# Patient Record
Sex: Female | Born: 1940 | Race: White | Hispanic: No | Smoking: Never smoker
Health system: Southern US, Community
[De-identification: ages and names within clinical notes are randomized; demographics above are authoritative.]

## PROBLEM LIST (undated history)

## (undated) DIAGNOSIS — C801 Malignant (primary) neoplasm, unspecified: Secondary | ICD-10-CM

## (undated) DIAGNOSIS — E7800 Pure hypercholesterolemia, unspecified: Secondary | ICD-10-CM

---

## 2015-05-15 ENCOUNTER — Other Ambulatory Visit: Payer: Self-pay | Admitting: Family Medicine

## 2015-05-15 DIAGNOSIS — Z780 Asymptomatic menopausal state: Secondary | ICD-10-CM

## 2015-05-18 ENCOUNTER — Other Ambulatory Visit: Payer: Self-pay | Admitting: Family Medicine

## 2015-05-18 DIAGNOSIS — Z1231 Encounter for screening mammogram for malignant neoplasm of breast: Secondary | ICD-10-CM

## 2015-05-24 ENCOUNTER — Ambulatory Visit
Admission: RE | Admit: 2015-05-24 | Discharge: 2015-05-24 | Disposition: A | Discharge status: Home / Self Care | Payer: Medicare Other | Source: Ambulatory Visit | Attending: Family Medicine | Admitting: Family Medicine

## 2015-05-24 DIAGNOSIS — Z780 Asymptomatic menopausal state: Secondary | ICD-10-CM

## 2015-05-24 DIAGNOSIS — Z1231 Encounter for screening mammogram for malignant neoplasm of breast: Secondary | ICD-10-CM | POA: Diagnosis not present

## 2015-05-24 HISTORY — DX: Malignant (primary) neoplasm, unspecified: C80.1

## 2016-08-05 ENCOUNTER — Other Ambulatory Visit: Payer: Self-pay | Admitting: Family Medicine

## 2016-08-05 DIAGNOSIS — Z1231 Encounter for screening mammogram for malignant neoplasm of breast: Secondary | ICD-10-CM

## 2016-08-12 ENCOUNTER — Encounter (INDEPENDENT_AMBULATORY_CARE_PROVIDER_SITE_OTHER): Payer: Self-pay

## 2016-08-12 ENCOUNTER — Ambulatory Visit
Admission: RE | Admit: 2016-08-12 | Discharge: 2016-08-12 | Disposition: A | Discharge status: Home / Self Care | Payer: Medicare Other | Source: Ambulatory Visit | Attending: Family Medicine | Admitting: Family Medicine

## 2016-08-12 ENCOUNTER — Other Ambulatory Visit: Payer: Self-pay | Admitting: Family Medicine

## 2016-08-12 DIAGNOSIS — Z1231 Encounter for screening mammogram for malignant neoplasm of breast: Secondary | ICD-10-CM | POA: Insufficient documentation

## 2017-02-25 ENCOUNTER — Other Ambulatory Visit: Payer: Self-pay | Admitting: Family Medicine

## 2017-02-25 DIAGNOSIS — Z780 Asymptomatic menopausal state: Secondary | ICD-10-CM

## 2017-08-26 ENCOUNTER — Other Ambulatory Visit: Payer: Self-pay | Admitting: Family Medicine

## 2017-08-26 DIAGNOSIS — Z1231 Encounter for screening mammogram for malignant neoplasm of breast: Secondary | ICD-10-CM

## 2017-09-14 ENCOUNTER — Ambulatory Visit
Admission: RE | Admit: 2017-09-14 | Discharge: 2017-09-14 | Disposition: A | Discharge status: Home / Self Care | Payer: Medicare Other | Source: Ambulatory Visit | Attending: Family Medicine | Admitting: Family Medicine

## 2017-09-14 DIAGNOSIS — Z1231 Encounter for screening mammogram for malignant neoplasm of breast: Secondary | ICD-10-CM | POA: Diagnosis present

## 2018-03-01 ENCOUNTER — Other Ambulatory Visit: Payer: Self-pay | Admitting: Family Medicine

## 2018-03-01 DIAGNOSIS — Z780 Asymptomatic menopausal state: Secondary | ICD-10-CM

## 2019-01-05 ENCOUNTER — Other Ambulatory Visit: Payer: Self-pay | Admitting: Family Medicine

## 2019-01-05 DIAGNOSIS — Z1231 Encounter for screening mammogram for malignant neoplasm of breast: Secondary | ICD-10-CM

## 2019-01-24 ENCOUNTER — Ambulatory Visit
Admission: RE | Admit: 2019-01-24 | Discharge: 2019-01-24 | Disposition: A | Discharge status: Home / Self Care | Payer: Medicare Other | Source: Ambulatory Visit | Attending: Family Medicine | Admitting: Family Medicine

## 2019-01-24 ENCOUNTER — Encounter (INDEPENDENT_AMBULATORY_CARE_PROVIDER_SITE_OTHER): Payer: Self-pay

## 2019-01-24 ENCOUNTER — Other Ambulatory Visit: Payer: Self-pay

## 2019-01-24 DIAGNOSIS — Z1231 Encounter for screening mammogram for malignant neoplasm of breast: Secondary | ICD-10-CM

## 2019-01-24 DIAGNOSIS — Z780 Asymptomatic menopausal state: Secondary | ICD-10-CM | POA: Insufficient documentation

## 2020-02-15 ENCOUNTER — Other Ambulatory Visit: Payer: Self-pay | Admitting: Family Medicine

## 2020-02-15 DIAGNOSIS — Z1231 Encounter for screening mammogram for malignant neoplasm of breast: Secondary | ICD-10-CM

## 2020-02-22 ENCOUNTER — Other Ambulatory Visit: Payer: Self-pay

## 2020-02-22 ENCOUNTER — Ambulatory Visit
Admission: RE | Admit: 2020-02-22 | Discharge: 2020-02-22 | Disposition: A | Discharge status: Home / Self Care | Payer: Medicare Other | Source: Ambulatory Visit | Attending: Family Medicine | Admitting: Family Medicine

## 2020-02-22 DIAGNOSIS — Z1231 Encounter for screening mammogram for malignant neoplasm of breast: Secondary | ICD-10-CM | POA: Diagnosis present

## 2021-02-12 ENCOUNTER — Other Ambulatory Visit: Payer: Self-pay | Admitting: Family Medicine

## 2021-02-12 DIAGNOSIS — Z1231 Encounter for screening mammogram for malignant neoplasm of breast: Secondary | ICD-10-CM

## 2021-03-25 ENCOUNTER — Other Ambulatory Visit: Payer: Self-pay

## 2021-03-25 ENCOUNTER — Ambulatory Visit
Admission: RE | Admit: 2021-03-25 | Discharge: 2021-03-25 | Disposition: A | Discharge status: Home / Self Care | Payer: Medicare Other | Source: Ambulatory Visit | Attending: Family Medicine | Admitting: Family Medicine

## 2021-03-25 DIAGNOSIS — Z1231 Encounter for screening mammogram for malignant neoplasm of breast: Secondary | ICD-10-CM | POA: Insufficient documentation

## 2021-04-20 ENCOUNTER — Ambulatory Visit
Admission: EM | Admit: 2021-04-20 | Discharge: 2021-04-20 | Disposition: A | Discharge status: Home / Self Care | Payer: Medicare Other | Attending: Emergency Medicine | Admitting: Emergency Medicine

## 2021-04-20 ENCOUNTER — Other Ambulatory Visit: Payer: Self-pay

## 2021-04-20 DIAGNOSIS — R300 Dysuria: Secondary | ICD-10-CM | POA: Diagnosis present

## 2021-04-20 DIAGNOSIS — N3001 Acute cystitis with hematuria: Secondary | ICD-10-CM | POA: Insufficient documentation

## 2021-04-20 HISTORY — DX: Pure hypercholesterolemia, unspecified: E78.00

## 2021-04-20 LAB — URINALYSIS, COMPLETE (UACMP) WITH MICROSCOPIC
Bilirubin Urine: NEGATIVE
Glucose, UA: NEGATIVE mg/dL
Ketones, ur: NEGATIVE mg/dL
Nitrite: NEGATIVE
Protein, ur: 30 mg/dL — AB
RBC / HPF: >50 RBC/hpf (ref 0–5)
Specific Gravity, Urine: 1.01 (ref 1.005–1.030)
pH: 5.5 (ref 5.0–8.0)

## 2021-04-20 MED ORDER — CIPROFLOXACIN HCL 250 MG PO TABS: 250.0000 mg | ORAL_TABLET | Freq: Two times a day (BID) | ORAL | 0 refills | Status: AC

## 2021-04-20 NOTE — ED Triage Notes (Signed)
Pt with 2 days of pain at end of urine stream and today saw blood in urine

## 2021-04-20 NOTE — Discharge Instructions (Signed)

## 2021-04-20 NOTE — ED Provider Notes (Signed)
[facility]    CSN: [id number] Arrival date & time: 04/20/21  2703      History   Chief Complaint Chief Complaint  Patient presents with   Dysuria    HPI Kiara Alvarado is a 80 y.o. female presenting for 2-day history of dysuria, urinary frequency and urgency as well as bladder pressure and blood in the urine.  Patient reports she believes she has a UTI.  Has had UTIs in the past and says she normally gets about 1 year.  She denies any fever, chills, fatigue, lower back pain or abdominal pain.  Has not been taking any OTC meds for symptoms.  No other complaints.  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   History reviewed. No pertinent surgical history.  OB History   No obstetric history on file.      Home Medications    Prior to Admission medications   Medication Sig Start Date End Date Taking? Authorizing Provider  atorvastatin (LIPITOR) 20 MG tablet Take 1 tablet by mouth daily. 09/07/20  Yes [provider]  Cholecalciferol 50 MCG (2000 UT) TABS Take by mouth. 02/24/17  Yes [provider]  ciprofloxacin (CIPRO) 250 MG tablet Take 1 tablet (250 mg total) by mouth every 12 (twelve) hours for 7 days. 04/20/21 04/27/21 Yes Danton Clap, PA-C  aspirin 81 MG EC tablet Take by mouth.    [provider]    Family History Family History  Problem Relation Age of Onset   Breast cancer Neg Hx     Social History Social History   Tobacco Use   Smoking status: Never   Smokeless tobacco: Never  Vaping Use   Vaping Use: Never used  Substance Use Topics   Alcohol use: Yes    Comment: occasional   Drug use: Never     Allergies   Patient has no known allergies.   Review of Systems Review of Systems  Constitutional:  Negative for chills, fatigue and fever.  Gastrointestinal:  Negative for  abdominal pain, diarrhea, nausea and vomiting.  Genitourinary:  Positive for dysuria, frequency, hematuria and urgency. Negative for decreased urine volume, flank pain, pelvic pain, vaginal bleeding, vaginal discharge and vaginal pain.  Musculoskeletal:  Negative for back pain.  Skin:  Negative for rash.    Physical Exam Triage Vital Signs ED Triage Vitals  Enc Vitals Group     BP 04/20/21 0955 (!) 141/75     Pulse Rate 04/20/21 0955 65     Resp 04/20/21 0955 16     Temp 04/20/21 0955 97.9 F (36.6 C)     Temp Source 04/20/21 0955 Oral     SpO2 04/20/21 0955 98 %     Weight 04/20/21 0956 133 lb (60.3 kg)     Height 04/20/21 0956 5\' 5"  (1.651 m)     Head Circumference --      Peak Flow --      Pain Score 04/20/21 0956 9     Pain Loc --      Pain Edu? --      Excl. in [place]? --    No data found.  Updated Vital Signs BP (!) 141/75 (BP Location: Left Arm)   Pulse 65   Temp 97.9 F (36.6 C) (Oral)   Resp 16   Ht  5\' 5"  (1.651 m)   Wt 133 lb (60.3 kg)   SpO2 98%   BMI 22.13 kg/m   Physical Exam Vitals and nursing note reviewed.  Constitutional:      General: She is not in acute distress.    Appearance: Normal appearance. She is not ill-appearing or toxic-appearing.  HENT:     Head: Normocephalic and atraumatic.  Eyes:     General: No scleral icterus.       Right eye: No discharge.        Left eye: No discharge.     Conjunctiva/sclera: Conjunctivae normal.  Cardiovascular:     Rate and Rhythm: Normal rate and regular rhythm.     Heart sounds: Normal heart sounds.  Pulmonary:     Effort: Pulmonary effort is normal. No respiratory distress.     Breath sounds: Normal breath sounds.  Abdominal:     Palpations: Abdomen is soft.     Tenderness: There is abdominal tenderness (mild suprapubic). There is no right CVA tenderness or left CVA tenderness.  Musculoskeletal:     Cervical back: Neck supple.  Skin:    General: Skin is dry.  Neurological:     General: No focal  deficit present.     Mental Status: She is alert. Mental status is at baseline.     Motor: No weakness.     Gait: Gait normal.  Psychiatric:        Mood and Affect: Mood normal.        Behavior: Behavior normal.        Thought Content: Thought content normal.     UC Treatments / Results  Labs (all labs ordered are listed, but only abnormal results are displayed) Labs Reviewed  URINALYSIS, COMPLETE (UACMP) WITH MICROSCOPIC - Abnormal; Notable for the following components:      Result Value   APPearance CLOUDY (*)    Hgb urine dipstick LARGE (*)    Protein, ur 30 (*)    Leukocytes,Ua MODERATE (*)    Bacteria, UA FEW (*)    All other components within normal limits  URINE CULTURE    EKG   Radiology No results found.  Procedures Procedures (including critical care time)  Medications Ordered in UC Medications - No data to display  Initial Impression / Assessment and Plan / UC Course  I have reviewed the triage vital signs and the nursing notes.  Pertinent labs & imaging results that were available during my care of the patient were reviewed by me and considered in my medical decision making (see chart for details).   80 y/o female presenting for dysuria, urinary frequency and urgency.  No fever, chills or red flag signs/symptoms.  Urinalysis today shows cloudy urine, large hemoglobin, protein and moderate leukocytes.  We will send urine for culture and treat for UTI.  Patient reports that she is normally prescribed Cipro because it works the best for her.  I have sent Cipro to pharmacy.  Advised to increase rest and fluids.  Thoroughly reviewed return and ED precautions advised her we may change her antibiotic based on urine culture.  Follow-up here as needed.   Final Clinical Impressions(s) / UC Diagnoses   Final diagnoses:  Acute cystitis with hematuria  Dysuria     Discharge Instructions      UTI: Based on either symptoms or urinalysis, you may have a urinary  tract infection. We will send the urine for culture and call with results in a few days. Begin  antibiotics at this time. Your symptoms should be much improved over the next 2-3 days. Increase rest and fluid intake. If for some reason symptoms are worsening or not improving after a couple of days or the urine culture determines the antibiotics you are taking will not treat the infection, the antibiotics may be changed. Return or go to ER for fever, back pain, worsening urinary pain, discharge, increased blood in urine. May take Tylenol or Motrin OTC for pain relief or consider AZO if no contraindications      ED Prescriptions     Medication Sig Dispense Auth. Provider   ciprofloxacin (CIPRO) 250 MG tablet Take 1 tablet (250 mg total) by mouth every 12 (twelve) hours for 7 days. 14 tablet Gretta Cool      PDMP not reviewed this encounter.   Danton Clap, PA-C 04/20/21 1102

## 2021-04-22 LAB — URINE CULTURE: Culture: >=100000 — AB

## 2022-02-21 ENCOUNTER — Other Ambulatory Visit: Payer: Self-pay | Admitting: Family Medicine

## 2022-02-21 DIAGNOSIS — Z1231 Encounter for screening mammogram for malignant neoplasm of breast: Secondary | ICD-10-CM

## 2022-04-08 ENCOUNTER — Ambulatory Visit
Admission: RE | Admit: 2022-04-08 | Discharge: 2022-04-08 | Disposition: A | Discharge status: Home / Self Care | Payer: Medicare Other | Source: Ambulatory Visit | Attending: Family Medicine | Admitting: Family Medicine

## 2022-04-08 DIAGNOSIS — Z1231 Encounter for screening mammogram for malignant neoplasm of breast: Secondary | ICD-10-CM | POA: Diagnosis not present

## 2022-08-19 ENCOUNTER — Other Ambulatory Visit: Payer: Self-pay

## 2022-08-19 ENCOUNTER — Ambulatory Visit
Admission: EM | Admit: 2022-08-19 | Discharge: 2022-08-19 | Disposition: A | Discharge status: Home / Self Care | Payer: Medicare Other | Attending: Emergency Medicine | Admitting: Emergency Medicine

## 2022-08-19 ENCOUNTER — Encounter: Payer: Self-pay | Admitting: *Deleted

## 2022-08-19 DIAGNOSIS — N3001 Acute cystitis with hematuria: Secondary | ICD-10-CM | POA: Diagnosis not present

## 2022-08-19 LAB — URINALYSIS, W/ REFLEX TO CULTURE (INFECTION SUSPECTED)
Bilirubin Urine: NEGATIVE
Glucose, UA: NEGATIVE mg/dL
Ketones, ur: NEGATIVE mg/dL
Nitrite: NEGATIVE
Protein, ur: NEGATIVE mg/dL
Specific Gravity, Urine: <1.005 — ABNORMAL LOW (ref 1.005–1.030)
WBC, UA: >50 WBC/hpf (ref 0–5)
pH: 6 (ref 5.0–8.0)

## 2022-08-19 MED ORDER — CIPROFLOXACIN HCL 250 MG PO TABS: 250.0000 mg | ORAL_TABLET | Freq: Two times a day (BID) | ORAL | 0 refills | Status: AC

## 2022-08-19 NOTE — ED Triage Notes (Signed)
PT reports seeing blood in her urine this morning. Pt reports fequent UTI's

## 2022-08-19 NOTE — Discharge Instructions (Addendum)
Your urinalysis shows Kiara Alvarado blood cells and bacteria which are indicative of infection, your urine will be sent to the lab to determine exactly which bacteria is present, if any changes need to be made to your medications you will be notified  Begin use of ciprofloxacin every morning and every evening for 7 days  You may use over-the-counter Azo to help minimize your symptoms until antibiotic removes bacteria, this medication will turn your urine orange  Increase your fluid intake through use of water  As always practice good hygiene, wiping front to back and avoidance of scented vaginal products to prevent further irritation  If symptoms continue to persist after use of medication or recur please follow-up with urgent care or your primary doctor as needed

## 2022-08-19 NOTE — ED Provider Notes (Signed)
[facility]    CSN: [id number] Arrival date & time: 08/19/22  1339      History   Chief Complaint Chief Complaint  Patient presents with   Hematuria    HPI Kiara Alvarado is a 82 y.o. female.   Patient presents for evaluation of urinary frequency, urgency, dysuria and hematuria beginning today.  Has not attempted treatment of symptoms.  Endorses reoccurring UTI infections  at least once a year .  Denies abdominal pain or pressure, flank pain, fevers or vaginal symptoms.  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   History reviewed. No pertinent surgical history.  OB History   No obstetric history on file.      Home Medications    Prior to Admission medications   Medication Sig Start Date End Date Taking? Authorizing Provider  aspirin 81 MG EC tablet Take by mouth.    [provider]  atorvastatin (LIPITOR) 20 MG tablet Take 1 tablet by mouth daily. 09/07/20   [provider]  Cholecalciferol 50 MCG (2000 UT) TABS Take by mouth. 02/24/17   [provider]    Family History Family History  Problem Relation Age of Onset   Breast cancer Neg Hx     Social History Social History   Tobacco Use   Smoking status: Never   Smokeless tobacco: Never  Vaping Use   Vaping Use: Never used  Substance Use Topics   Alcohol use: Yes    Comment: occasional   Drug use: Never     Allergies   Patient has no known allergies.   Review of Systems Review of Systems  Constitutional: Negative.   HENT: Negative.    Respiratory: Negative.    Cardiovascular: Negative.   Genitourinary:  Positive for dysuria, frequency, hematuria and urgency. Negative for decreased urine volume, difficulty urinating, dyspareunia, enuresis, flank pain, genital sores, menstrual problem, pelvic pain, vaginal bleeding, vaginal discharge and  vaginal pain.     Physical Exam Triage Vital Signs ED Triage Vitals  Enc Vitals Group     BP 08/19/22 1452 (!) 150/80     Pulse Rate 08/19/22 1452 (!) 53     Resp 08/19/22 1452 18     Temp 08/19/22 1452 97.9 F (36.6 C)     Temp src --      SpO2 08/19/22 1452 97 %     Weight --      Height --      Head Circumference --      Peak Flow --      Pain Score 08/19/22 1450 0     Pain Loc --      Pain Edu? --      Excl. in [place]? --    No data found.  Updated Vital Signs BP (!) 150/80   Pulse (!) 53   Temp 97.9 F (36.6 C)   Resp 18   SpO2 97%   Visual Acuity Right Eye Distance:   Left Eye Distance:   Bilateral Distance:    Right Eye Near:   Left Eye Near:    Bilateral Near:     Physical Exam Constitutional:      Appearance: Normal appearance.  Eyes:     Extraocular Movements: Extraocular movements intact.  Pulmonary:     Effort: Pulmonary effort is normal.  Abdominal:  General: Abdomen is flat. Bowel sounds are normal.     Palpations: Abdomen is soft.     Tenderness: There is no abdominal tenderness. There is no right CVA tenderness or left CVA tenderness.     Hernia: No hernia is present.  Neurological:     Mental Status: She is alert and oriented to person, place, and time. Mental status is at baseline.      UC Treatments / Results  Labs (all labs ordered are listed, but only abnormal results are displayed) Labs Reviewed  URINALYSIS, W/ REFLEX TO CULTURE (INFECTION SUSPECTED)    EKG   Radiology No results found.  Procedures Procedures (including critical care time)  Medications Ordered in UC Medications - No data to display  Initial Impression / Assessment and Plan / UC Course  I have reviewed the triage vital signs and the nursing notes.  Pertinent labs & imaging results that were available during my care of the patient were reviewed by me and considered in my medical decision making (see chart for details).  Acute cystitis with  hematuria  Urinalysis showing leukocytes and bacteria, sent for culture, discussed with patient, patient requesting use of ciprofloxacin as she has had success with this medication in the past, prescribed, recommended additional supportive measures through increased fluid intake, Azo as needed and good hygiene measures, may follow-up with his urgent care or primary doctor if symptoms persist or worsen Final Clinical Impressions(s) / UC Diagnoses   Final diagnoses:  None   Discharge Instructions   None    ED Prescriptions   None    PDMP not reviewed this encounter.   Hans [place], [place] 08/19/22 [phone]

## 2022-08-21 ENCOUNTER — Telehealth (HOSPITAL_COMMUNITY): Payer: Self-pay | Admitting: Emergency Medicine

## 2022-08-21 LAB — URINE CULTURE: Culture: 20000 — AB

## 2022-08-21 MED ORDER — NITROFURANTOIN MONOHYD MACRO 100 MG PO CAPS: 100.0000 mg | ORAL_CAPSULE | Freq: Two times a day (BID) | ORAL | 0 refills | Status: AC

## 2023-02-06 IMAGING — MG MM DIGITAL SCREENING BILAT W/ TOMO AND CAD
8 series · 8 of 24 positions shown · non-contrast
Comparison: Previous exam(s).

CLINICAL DATA: Screening.

EXAM:
DIGITAL SCREENING BILATERAL MAMMOGRAM WITH TOMOSYNTHESIS AND CAD
TECHNIQUE: Bilateral screening digital craniocaudal and mediolateral oblique
mammograms were obtained. Bilateral screening digital breast
tomosynthesis was performed. The images were evaluated with
computer-aided detection.

[L CC synth-2D]
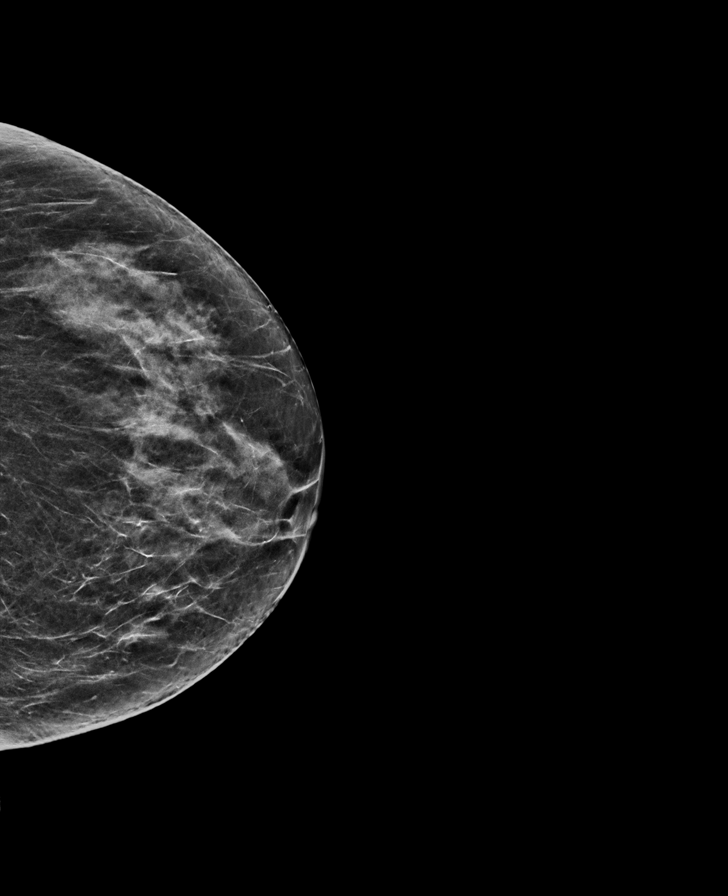

[R MLO synth-2D]
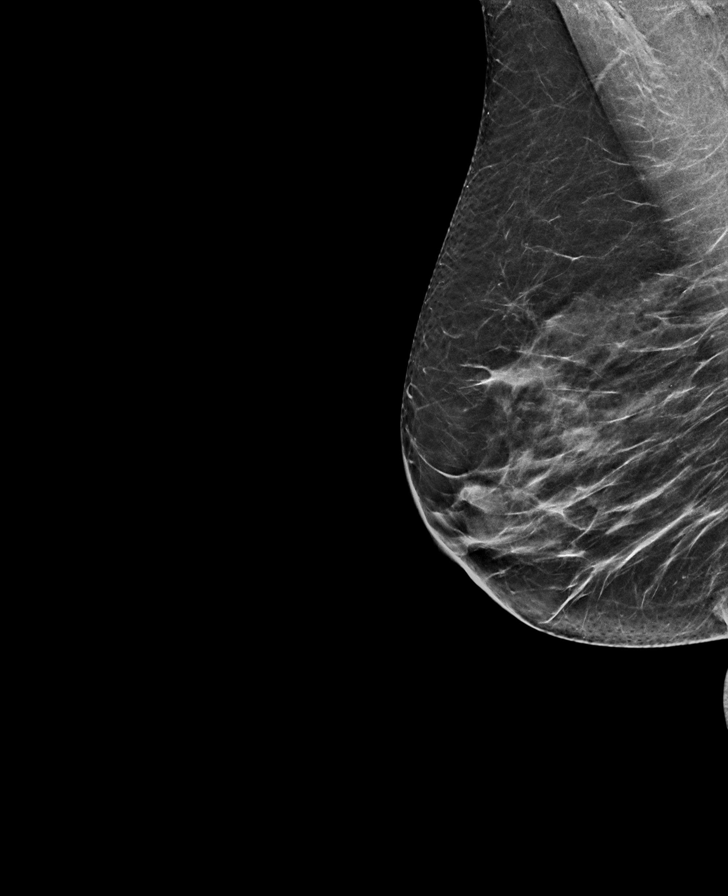

[R CC synth-2D]
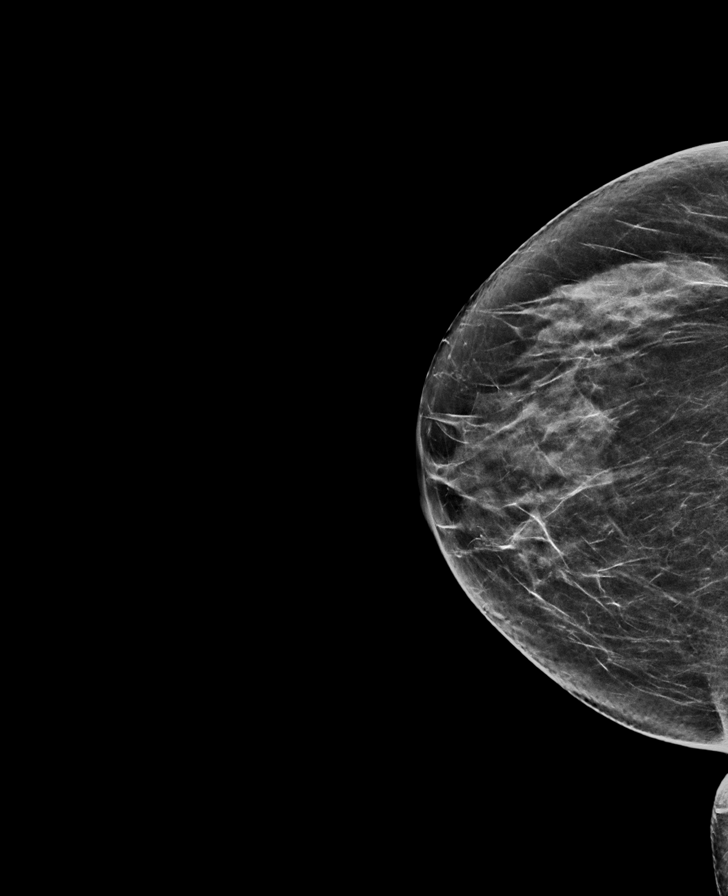

[L MLO synth-2D]
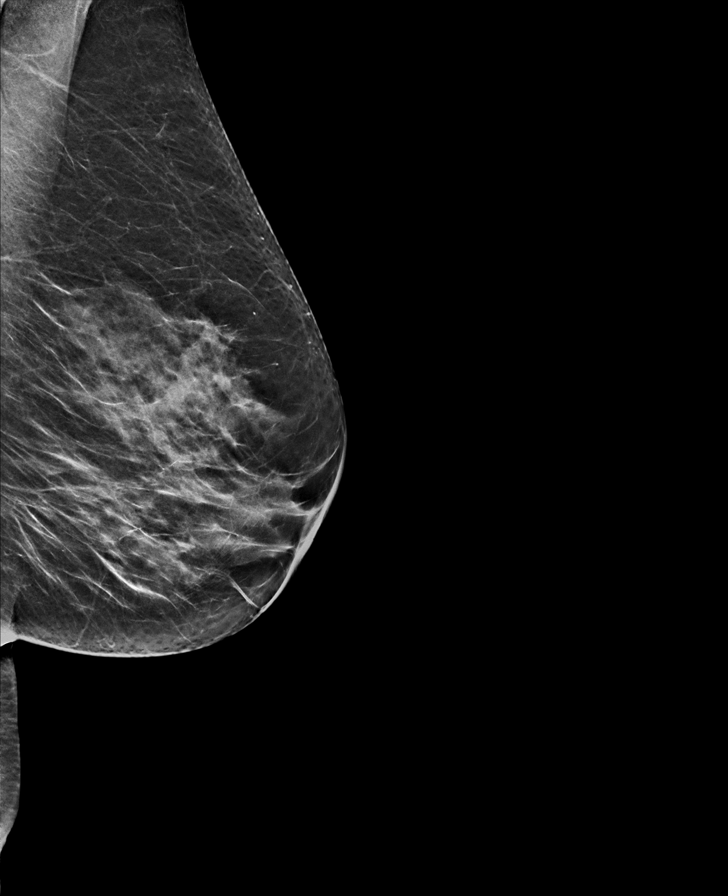

[L MLO tomo · tomo slice 31/60.0]
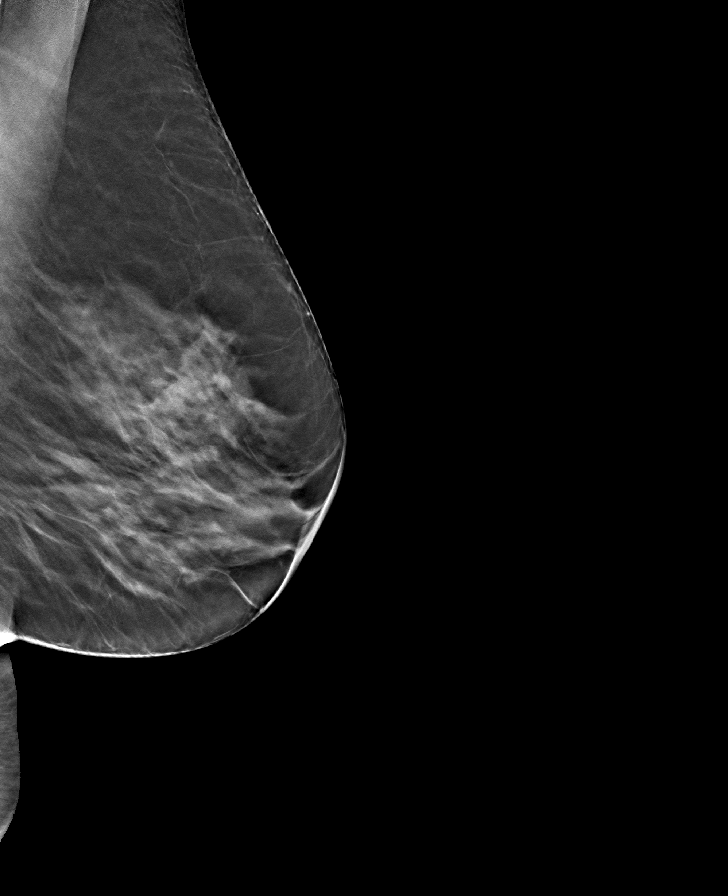

[R MLO tomo · tomo slice 30/59.0]
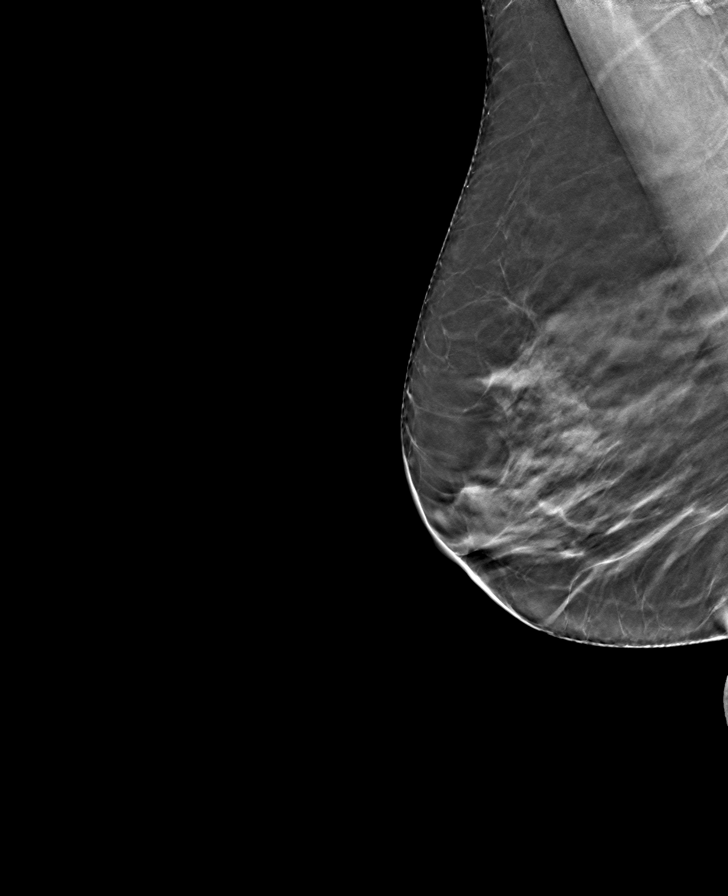

[R CC tomo · tomo slice 31/60.0]
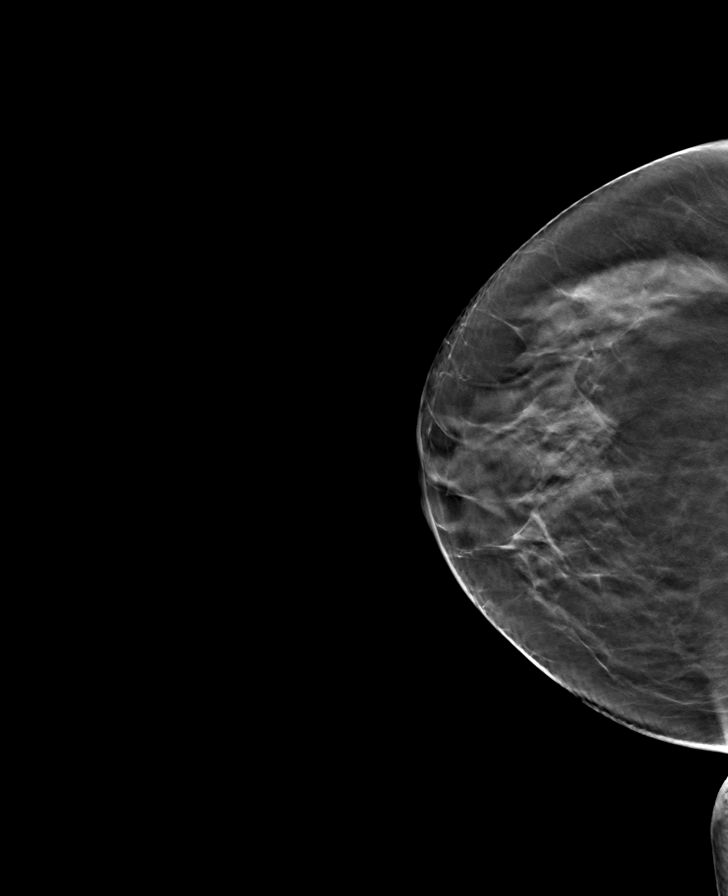

[L CC tomo · tomo slice 29/58.0]
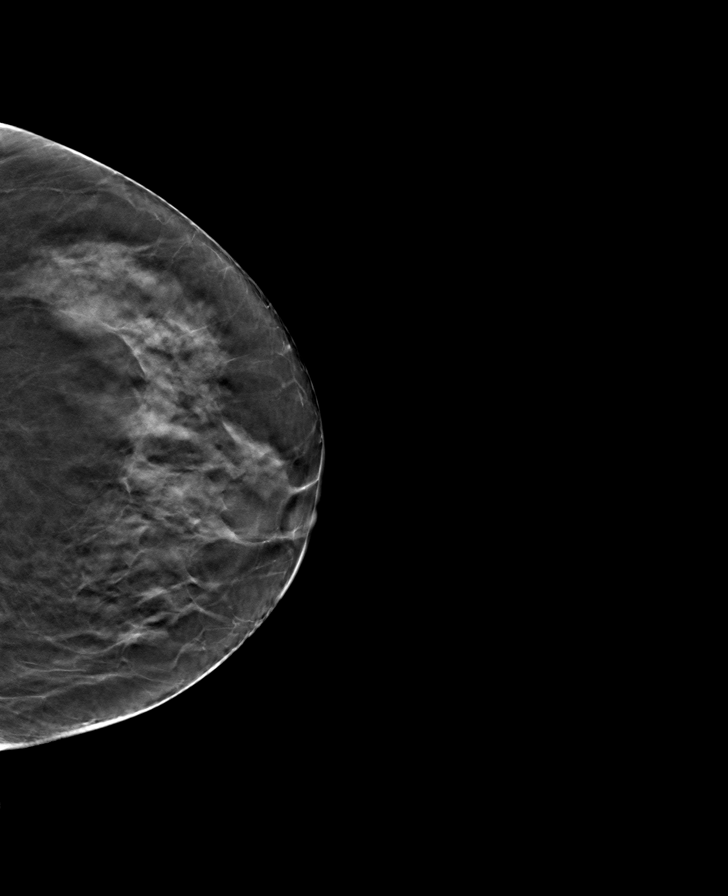

[8 of 24 positions shown; findings below may reference images not displayed]

ACR Breast Density Category c: The breast tissue is heterogeneously
dense, which may obscure small masses.
FINDINGS: There are no findings suspicious for malignancy.
IMPRESSION: No mammographic evidence of malignancy. A result letter of this
screening mammogram will be mailed directly to the patient.

RECOMMENDATION:
Screening mammogram in one year. (Code:Q3-W-BC3)

BI-RADS CATEGORY  1: Negative.

## 2023-02-23 ENCOUNTER — Other Ambulatory Visit: Payer: Self-pay | Admitting: Family Medicine

## 2023-02-23 DIAGNOSIS — Z1231 Encounter for screening mammogram for malignant neoplasm of breast: Secondary | ICD-10-CM

## 2023-04-13 ENCOUNTER — Ambulatory Visit
Admission: RE | Admit: 2023-04-13 | Discharge: 2023-04-13 | Disposition: A | Discharge status: Home / Self Care | Payer: Medicare Other | Source: Ambulatory Visit | Attending: Family Medicine | Admitting: Family Medicine

## 2023-04-13 DIAGNOSIS — Z1231 Encounter for screening mammogram for malignant neoplasm of breast: Secondary | ICD-10-CM | POA: Diagnosis present
